# Patient Record
Sex: Female | Born: 1980 | Hispanic: No | Marital: Married | Smoking: Never smoker
Health system: Southern US, Community
[De-identification: ages and names within clinical notes are randomized; demographics above are authoritative.]

## PROBLEM LIST (undated history)

## (undated) DIAGNOSIS — E079 Disorder of thyroid, unspecified: Secondary | ICD-10-CM

---

## 2007-02-03 ENCOUNTER — Encounter: Admission: RE | Admit: 2007-02-03 | Discharge: 2007-02-03 | Payer: Self-pay | Admitting: Internal Medicine

## 2009-01-01 ENCOUNTER — Ambulatory Visit (HOSPITAL_BASED_OUTPATIENT_CLINIC_OR_DEPARTMENT_OTHER): Admission: RE | Admit: 2009-01-01 | Discharge: 2009-01-01 | Payer: Self-pay | Admitting: Family Medicine

## 2009-01-01 ENCOUNTER — Ambulatory Visit: Payer: Self-pay | Admitting: Diagnostic Radiology

## 2009-01-01 ENCOUNTER — Encounter: Payer: Self-pay | Admitting: Family Medicine

## 2009-01-21 ENCOUNTER — Encounter: Admission: RE | Admit: 2009-01-21 | Discharge: 2009-01-21 | Payer: Self-pay | Admitting: Internal Medicine

## 2009-03-03 ENCOUNTER — Ambulatory Visit (HOSPITAL_COMMUNITY): Admission: RE | Admit: 2009-03-03 | Discharge: 2009-03-03 | Payer: Self-pay | Admitting: Internal Medicine

## 2010-01-05 ENCOUNTER — Encounter: Admission: RE | Admit: 2010-01-05 | Discharge: 2010-01-05 | Payer: Self-pay | Admitting: Endocrinology

## 2010-02-18 ENCOUNTER — Emergency Department (HOSPITAL_BASED_OUTPATIENT_CLINIC_OR_DEPARTMENT_OTHER): Admission: EM | Admit: 2010-02-18 | Discharge: 2010-02-18 | Payer: Self-pay | Admitting: Emergency Medicine

## 2010-11-22 ENCOUNTER — Encounter: Payer: Self-pay | Admitting: Internal Medicine

## 2010-11-27 ENCOUNTER — Encounter
Admission: RE | Admit: 2010-11-27 | Discharge: 2010-11-27 | Payer: Self-pay | Source: Home / Self Care | Attending: Endocrinology | Admitting: Endocrinology

## 2010-12-22 IMAGING — US US SOFT TISSUE HEAD/NECK
1 series · 14 of 25 positions shown · non-contrast
Comparison: 01/21/2009, 03/03/2009

CLINICAL DATA: Thyroid goiter

THYROID ULTRASOUND
TECHNIQUE: Ultrasound examination of the thyroid gland and
adjacent soft tissues was performed.

[Series 1: us soft tissue head/neck · 0.06mm/px · 14 of 39 slices shown]
[im 1/39]
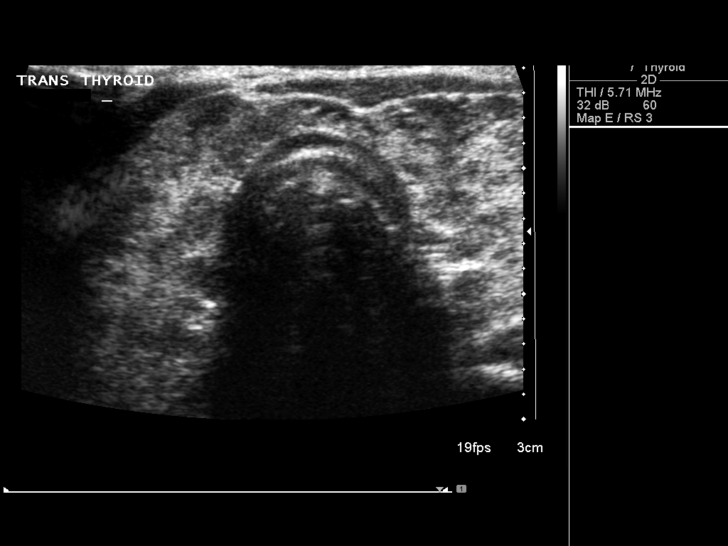
[im 4/39]
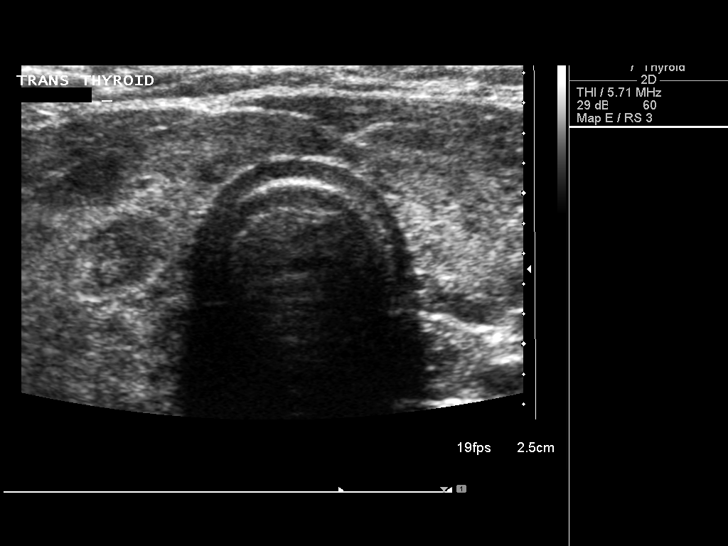
[im 7/39]
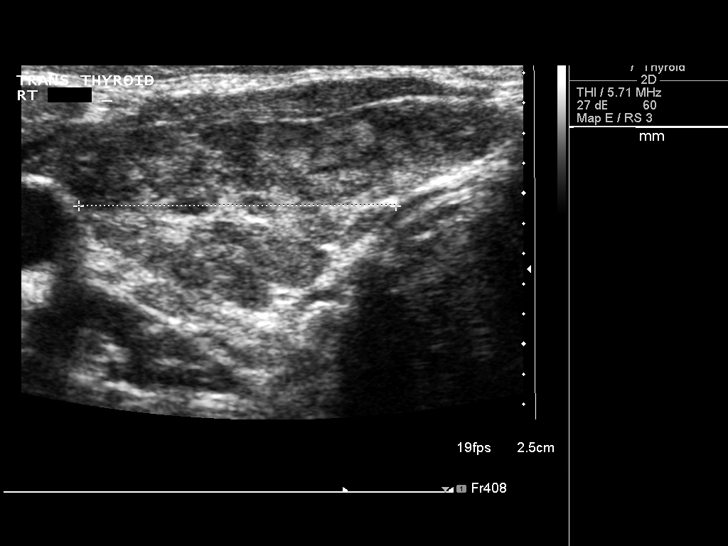
[im 10/39]
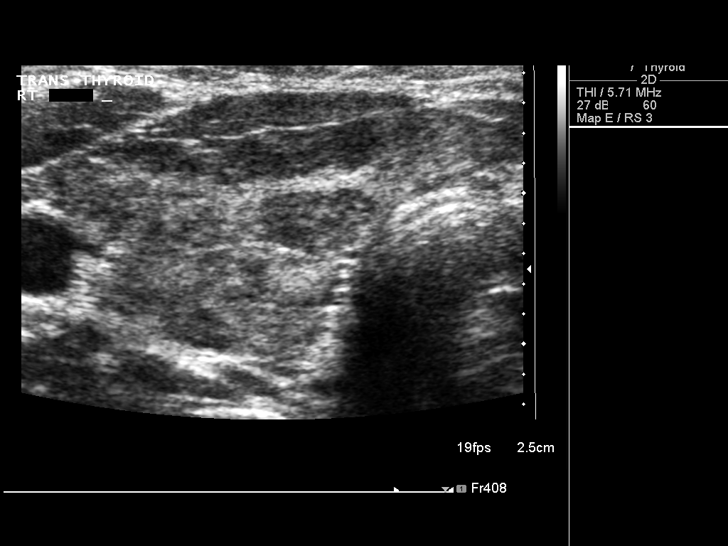
[im 13/39]
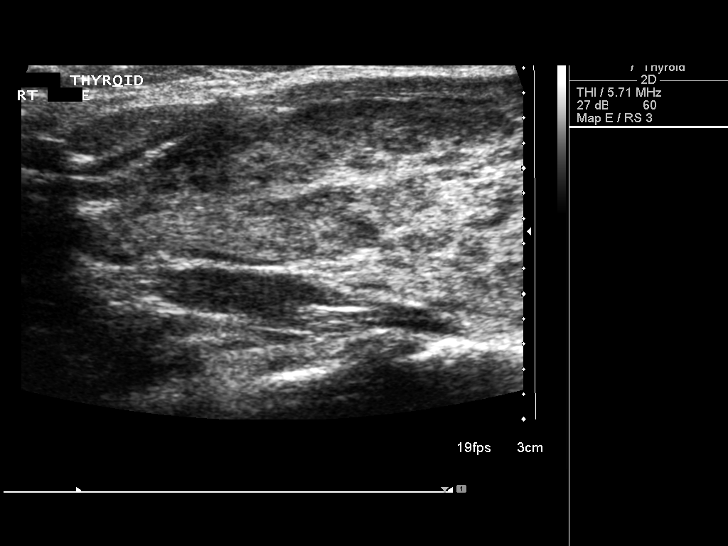
[im 15/39]
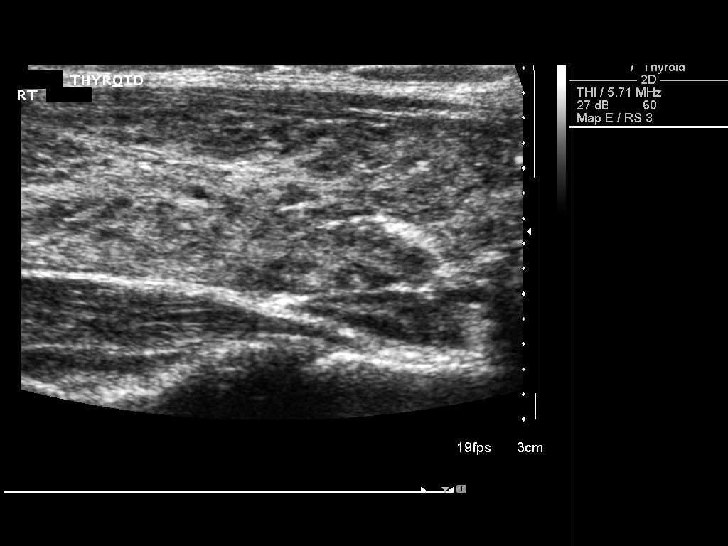
[im 18/39]
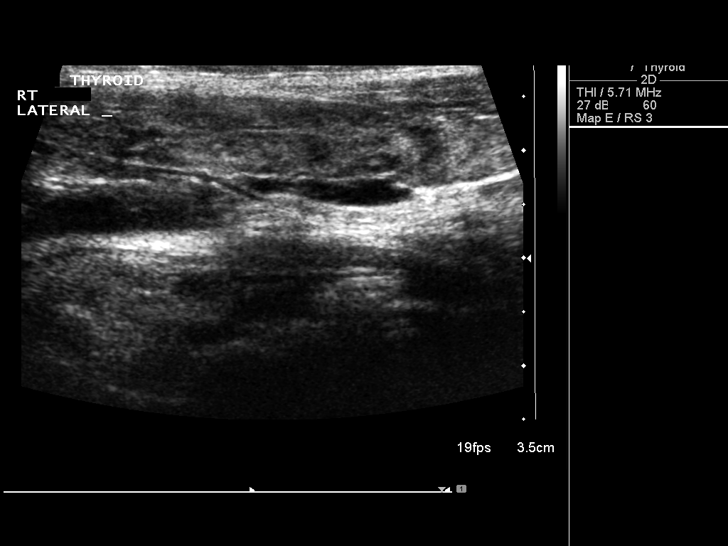
[im 21/39]
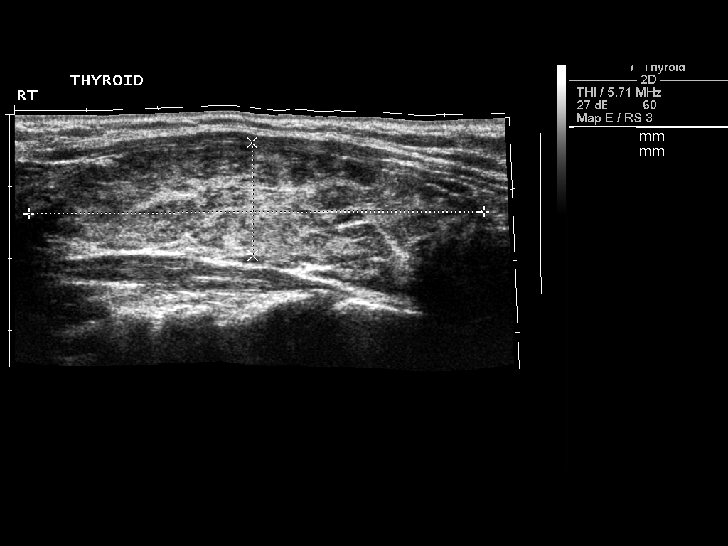
[im 24/39]
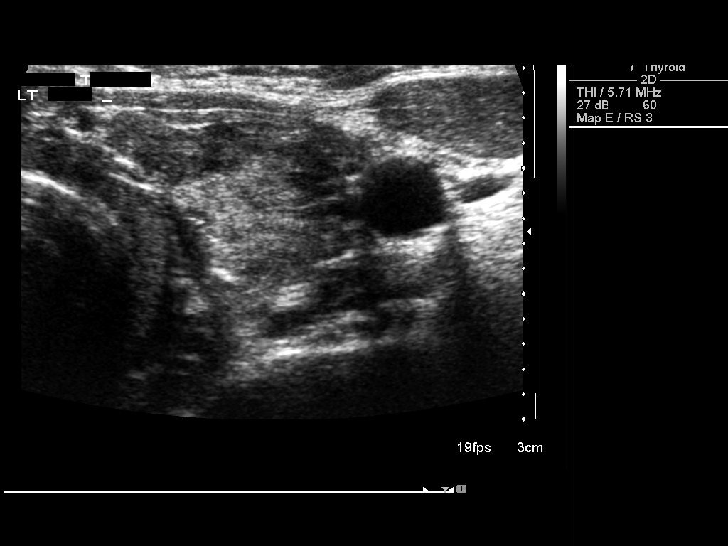
[im 26/39]
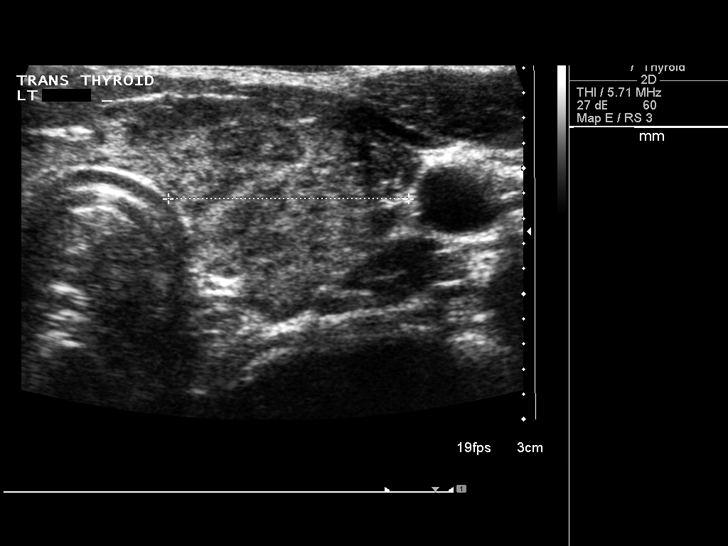
[im 29/39]
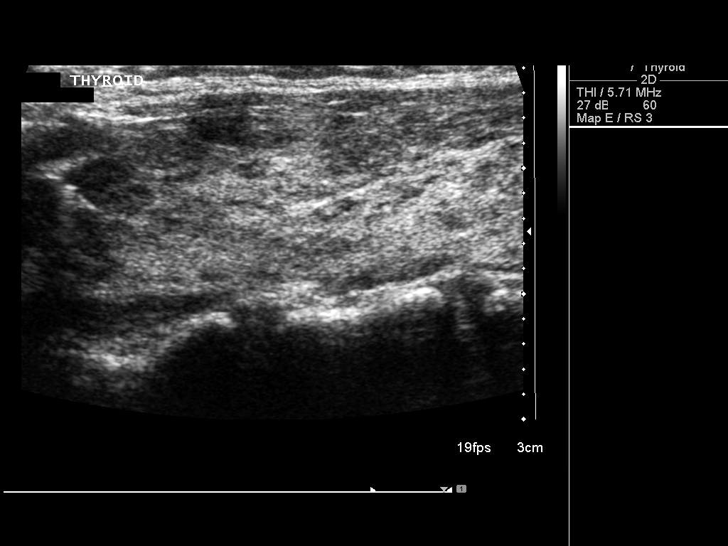
[im 32/39]
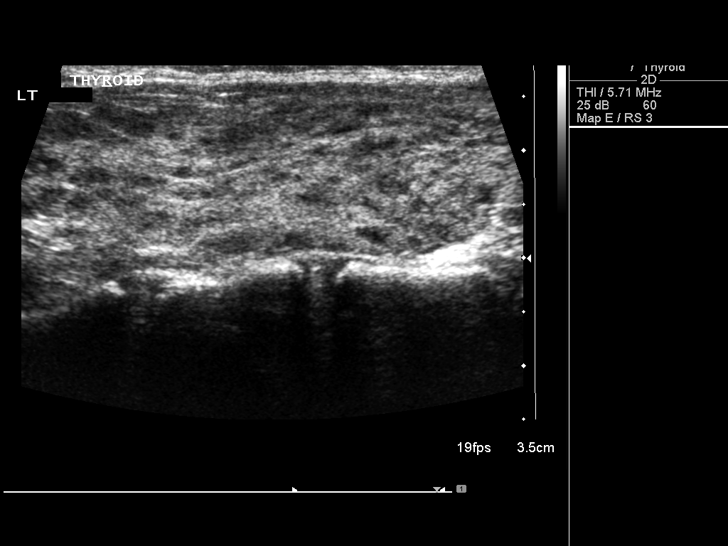
[im 35/39]
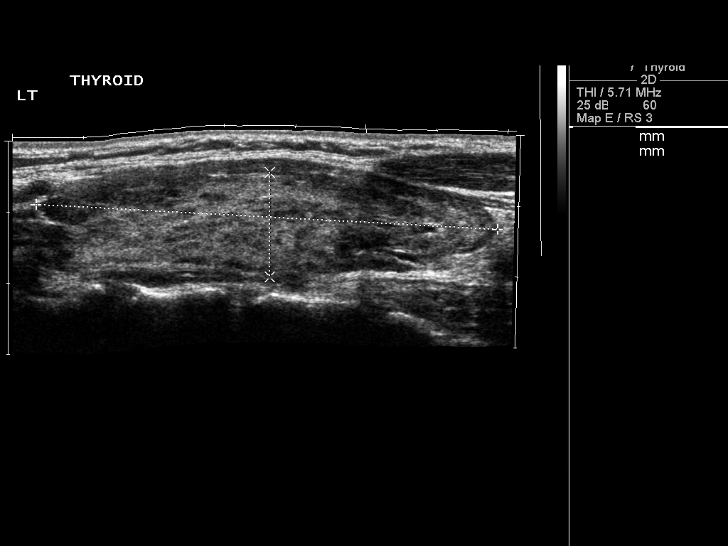
[im 39/39]
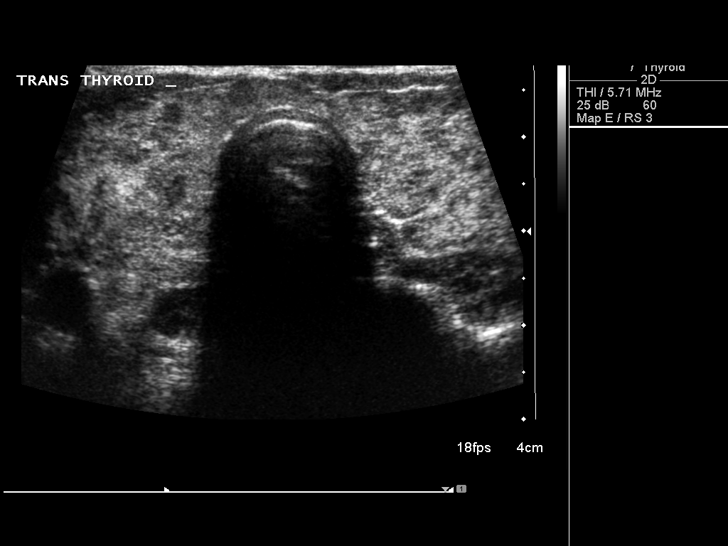

[14 of 25 positions shown; findings below may reference images not displayed]

FINDINGS: Right thyroid lobe measures 6.3 x 1.6 x 2.1 cm.  Left
thyroid lobe measures 6.5 x 1.5 x 1.9 cm.  Thyroid isthmus measures
4 mm in thickness.

Thyroid echotexture remains diffusely heterogeneous with mild
enlargement of the entire thyroid gland.  No discrete thyroid
nodule, mass or cyst.  Exam is stable.
IMPRESSION: Stable heterogeneous enlargement of the thyroid gland.

## 2011-01-19 LAB — URINALYSIS, ROUTINE W REFLEX MICROSCOPIC
Bilirubin Urine: NEGATIVE
Nitrite: NEGATIVE
Specific Gravity, Urine: 1.026 (ref 1.005–1.030)
pH: 7.5 (ref 5.0–8.0)

## 2011-01-19 LAB — URINE MICROSCOPIC-ADD ON

## 2011-11-13 IMAGING — US US SOFT TISSUE HEAD/NECK
1 series · 14 of 25 positions shown · non-contrast
Comparison: Thyroid ultrasound 01/05/2010

CLINICAL DATA: Follow-up thyroid goiter

THYROID ULTRASOUND
TECHNIQUE: Ultrasound examination of the thyroid gland and adjacent
soft tissues was performed.

[Series 1: us soft tissue head/neck · 0.08mm/px · 14 of 39 slices shown]
[im 1/39]
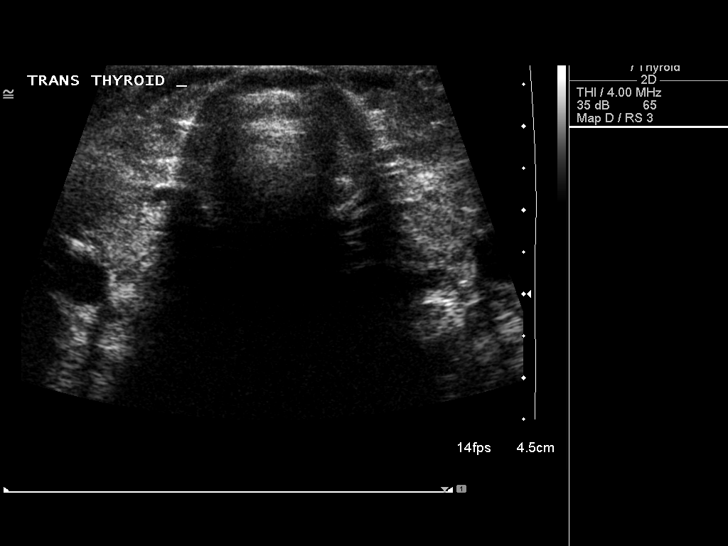
[im 4/39]
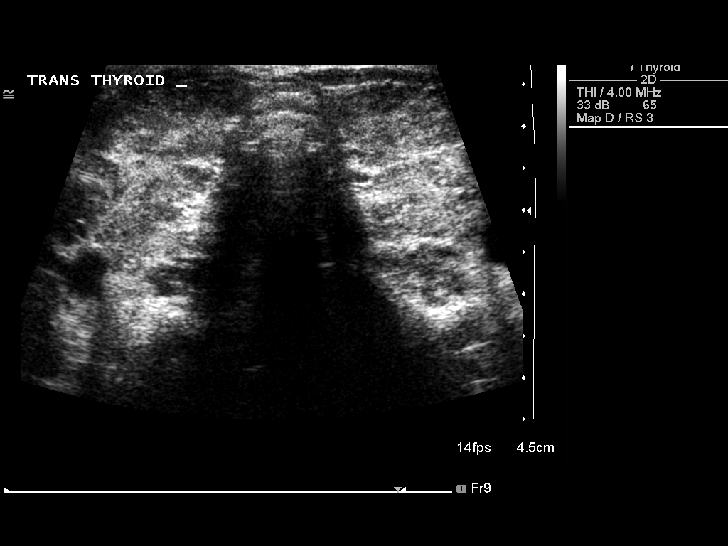
[im 7/39]
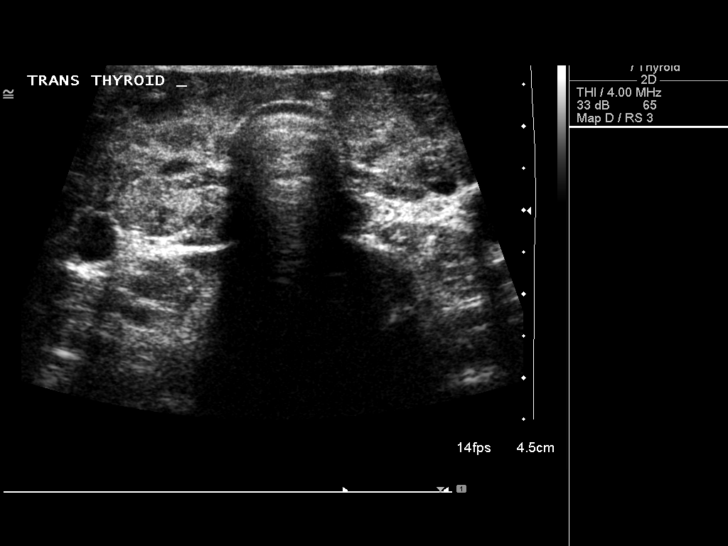
[im 10/39]
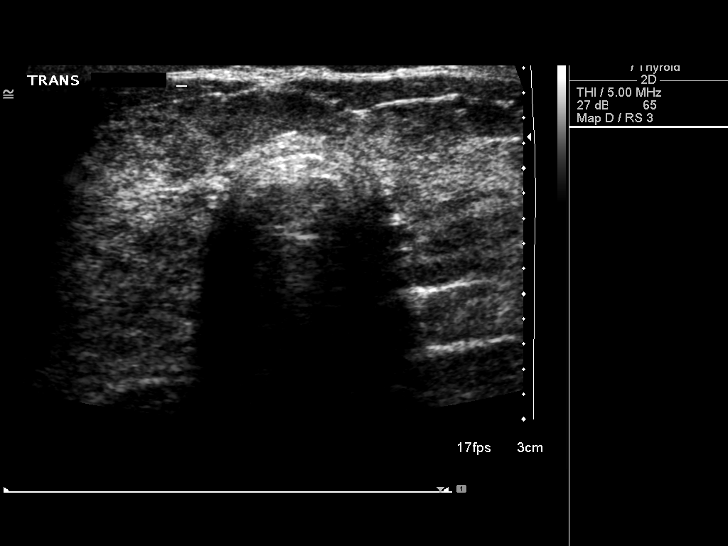
[im 13/39]
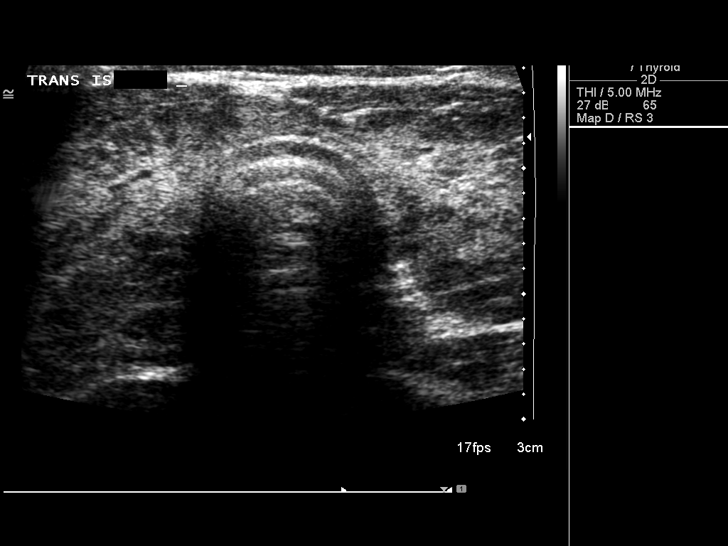
[im 15/39]
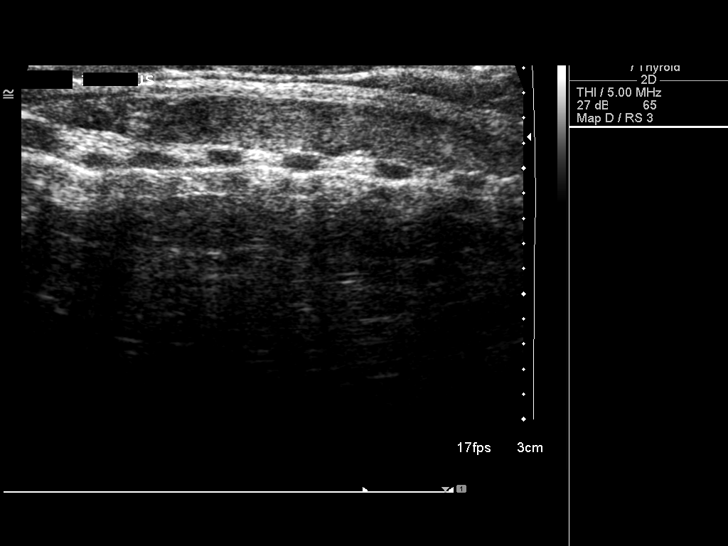
[im 18/39]
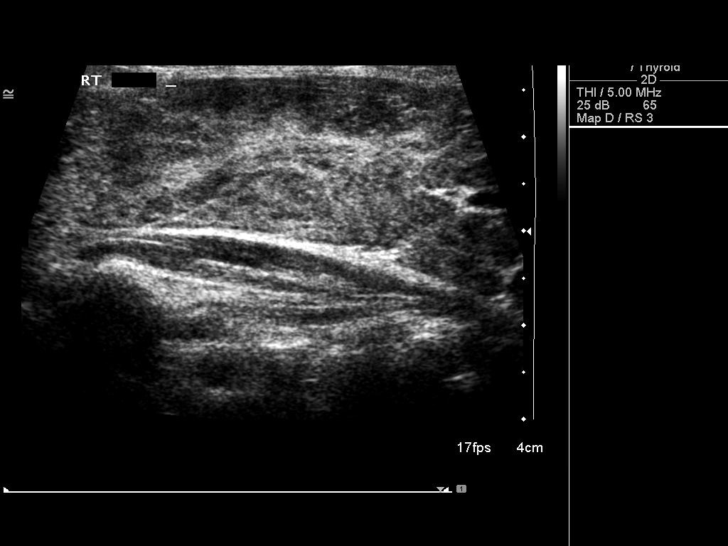
[im 21/39]
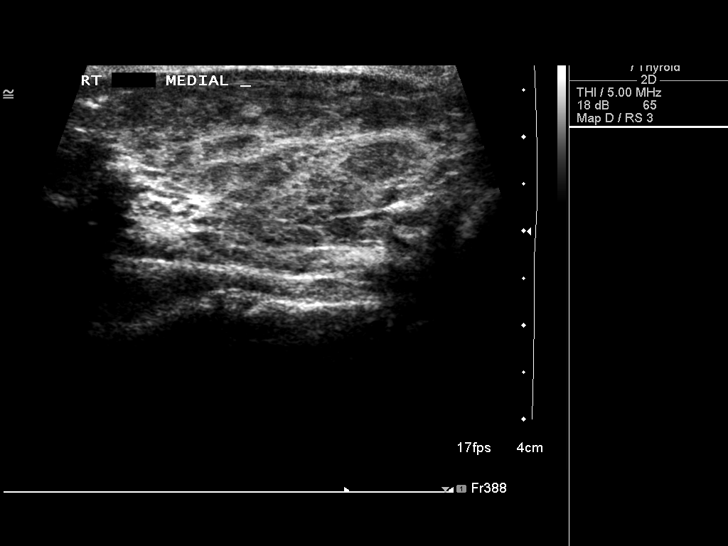
[im 24/39]
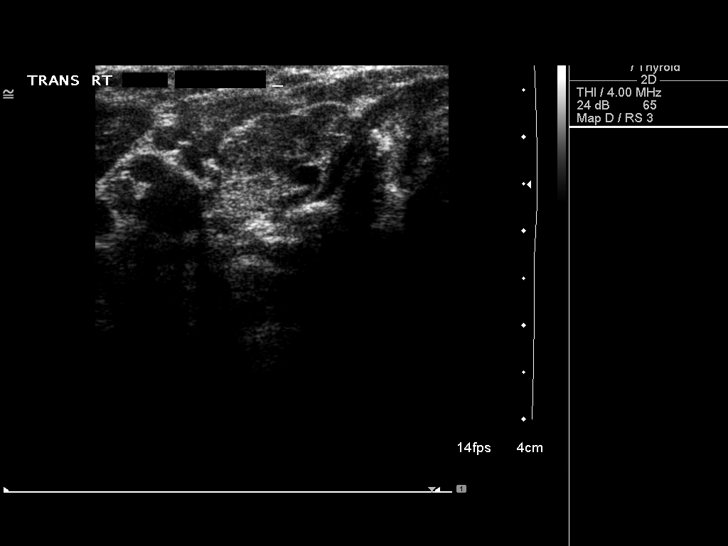
[im 26/39]
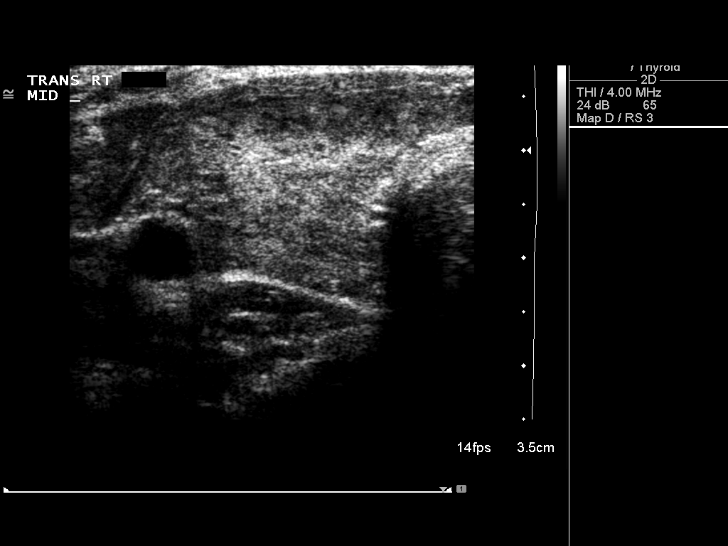
[im 29/39]
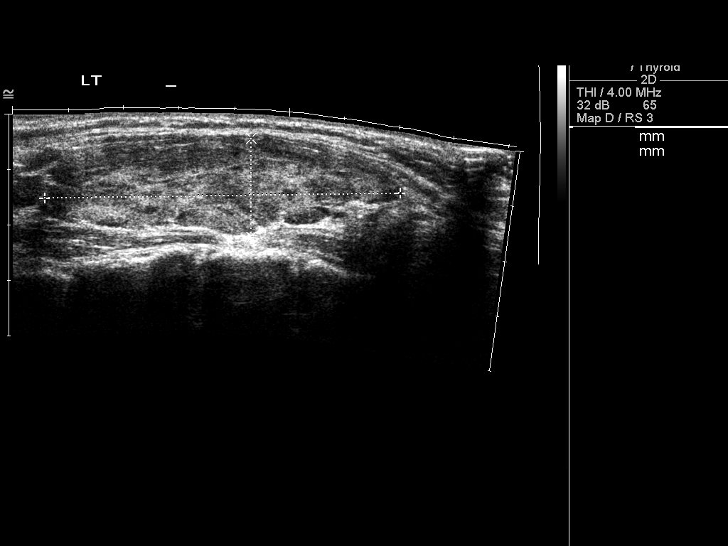
[im 32/39]
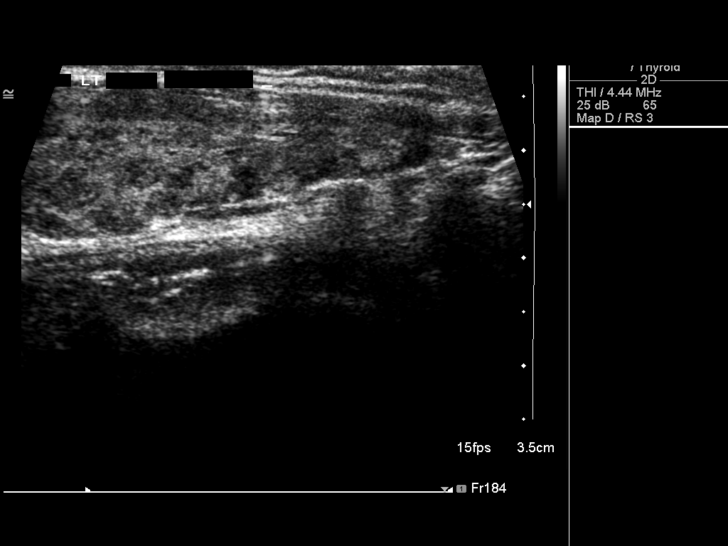
[im 35/39]
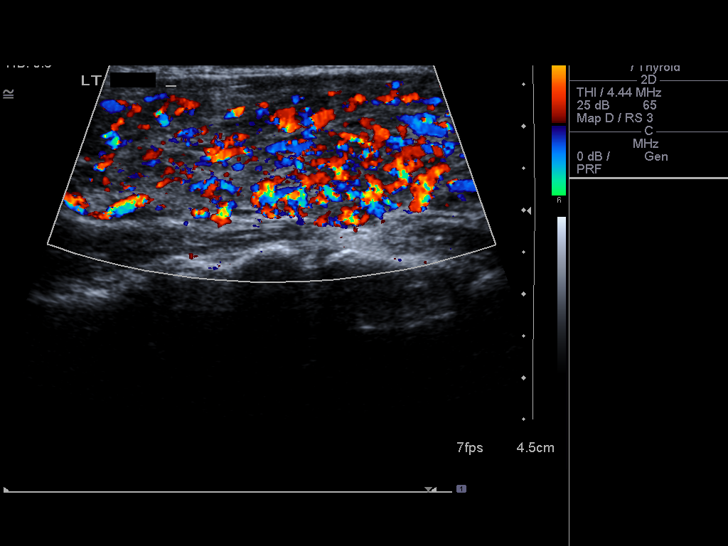
[im 39/39]
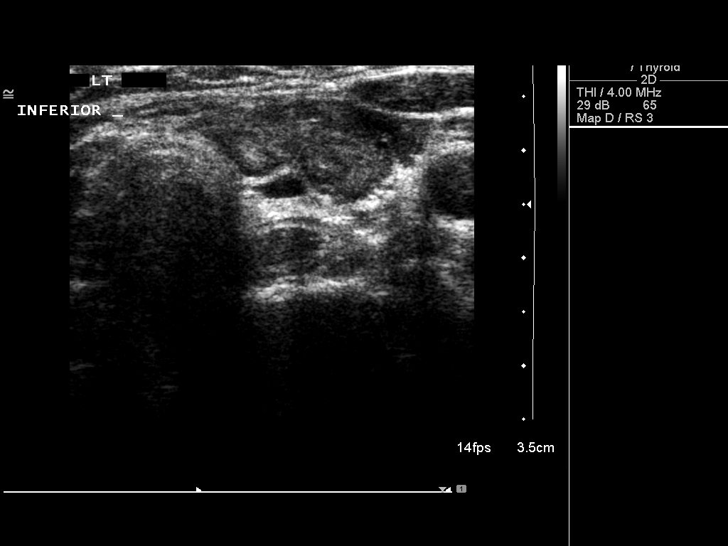

[14 of 25 positions shown; findings below may reference images not displayed]

FINDINGS: Right thyroid lobe:  6.2 x 1.8 x 2.4 cm.
Left thyroid lobe:  6.4 x 1.8 x 2.5 cm.
Isthmus:   0.4 cm.

Focal nodules:  No focal nodules.  The thyroid gland is
heterogeneous in echotexture and hypervascular.

Lymphadenopathy:  None visualized.
IMPRESSION: Heterogeneous mildly enlarged thyroid gland.  No discrete
nodularity.  No change from prior.

## 2012-01-13 ENCOUNTER — Other Ambulatory Visit (HOSPITAL_COMMUNITY)
Admission: RE | Admit: 2012-01-13 | Discharge: 2012-01-13 | Disposition: A | Discharge status: Home / Self Care | Payer: BC Managed Care – PPO | Source: Ambulatory Visit | Attending: Obstetrics and Gynecology | Admitting: Obstetrics and Gynecology

## 2012-01-13 DIAGNOSIS — Z124 Encounter for screening for malignant neoplasm of cervix: Secondary | ICD-10-CM | POA: Insufficient documentation

## 2014-11-01 HISTORY — PX: GANGLION CYST EXCISION: SHX1691

## 2018-03-12 ENCOUNTER — Other Ambulatory Visit: Payer: Self-pay

## 2018-03-12 ENCOUNTER — Encounter (HOSPITAL_BASED_OUTPATIENT_CLINIC_OR_DEPARTMENT_OTHER): Payer: Self-pay | Admitting: Emergency Medicine

## 2018-03-12 ENCOUNTER — Emergency Department (HOSPITAL_BASED_OUTPATIENT_CLINIC_OR_DEPARTMENT_OTHER)
Admission: EM | Admit: 2018-03-12 | Discharge: 2018-03-13 | Disposition: A | Discharge status: Home / Self Care | Payer: BLUE CROSS/BLUE SHIELD | Attending: Emergency Medicine | Admitting: Emergency Medicine

## 2018-03-12 DIAGNOSIS — B0231 Zoster conjunctivitis: Secondary | ICD-10-CM | POA: Diagnosis not present

## 2018-03-12 DIAGNOSIS — R51 Headache: Secondary | ICD-10-CM | POA: Diagnosis present

## 2018-03-12 DIAGNOSIS — Z79899 Other long term (current) drug therapy: Secondary | ICD-10-CM | POA: Insufficient documentation

## 2018-03-12 HISTORY — DX: Disorder of thyroid, unspecified: E07.9

## 2018-03-12 LAB — LIPASE, BLOOD: Lipase: 23 U/L (ref 11–51)

## 2018-03-12 LAB — BASIC METABOLIC PANEL
ANION GAP: 9 (ref 5–15)
BUN: 13 mg/dL (ref 6–20)
CHLORIDE: 106 mmol/L (ref 101–111)
CO2: 23 mmol/L (ref 22–32)
CREATININE: 0.6 mg/dL (ref 0.44–1.00)
Calcium: 8.8 mg/dL — ABNORMAL LOW (ref 8.9–10.3)
GFR calc non Af Amer: >60 mL/min (ref >60)
Glucose, Bld: 138 mg/dL — ABNORMAL HIGH (ref 65–99)
Potassium: 4.1 mmol/L (ref 3.5–5.1)
SODIUM: 138 mmol/L (ref 135–145)

## 2018-03-12 LAB — CBC WITH DIFFERENTIAL/PLATELET
Basophils Absolute: 0 10*3/uL (ref 0.0–0.1)
Basophils Relative: 0 %
EOS ABS: 0.1 10*3/uL (ref 0.0–0.7)
Eosinophils Relative: 1 %
HEMATOCRIT: 38.6 % (ref 36.0–46.0)
HEMOGLOBIN: 13 g/dL (ref 12.0–15.0)
LYMPHS ABS: 1.8 10*3/uL (ref 0.7–4.0)
Lymphocytes Relative: 16 %
MCH: 28.5 pg (ref 26.0–34.0)
MCHC: 33.7 g/dL (ref 30.0–36.0)
MCV: 84.6 fL (ref 78.0–100.0)
MONOS PCT: 7 %
Monocytes Absolute: 0.8 10*3/uL (ref 0.1–1.0)
Neutro Abs: 8.9 10*3/uL — ABNORMAL HIGH (ref 1.7–7.7)
Neutrophils Relative %: 76 %
Platelets: 273 10*3/uL (ref 150–400)
RBC: 4.56 MIL/uL (ref 3.87–5.11)
RDW: 13.1 % (ref 11.5–15.5)
WBC: 11.7 10*3/uL — AB (ref 4.0–10.5)

## 2018-03-12 LAB — PREGNANCY, URINE: Preg Test, Ur: NEGATIVE

## 2018-03-12 LAB — HEPATIC FUNCTION PANEL
ALBUMIN: 3.9 g/dL (ref 3.5–5.0)
ALK PHOS: 50 U/L (ref 38–126)
ALT: 12 U/L — AB (ref 14–54)
AST: 16 U/L (ref 15–41)
BILIRUBIN TOTAL: 0.4 mg/dL (ref 0.3–1.2)
Bilirubin, Direct: <0.1 mg/dL — ABNORMAL LOW (ref 0.1–0.5)
TOTAL PROTEIN: 7.6 g/dL (ref 6.5–8.1)

## 2018-03-12 MED ORDER — PROMETHAZINE HCL 25 MG/ML IJ SOLN
25.0000 mg | Freq: Once | INTRAMUSCULAR | Status: AC
  Administered 2018-03-12: 25 mg via INTRAVENOUS
  Filled 2018-03-12: qty 1

## 2018-03-12 MED ORDER — TETRACAINE HCL 0.5 % OP SOLN
1.0000 [drp] | Freq: Once | OPHTHALMIC | Status: AC
  Administered 2018-03-12: 1 [drp] via OPHTHALMIC

## 2018-03-12 MED ORDER — VALACYCLOVIR HCL 1 G PO TABS: 1000.0000 mg | ORAL_TABLET | Freq: Three times a day (TID) | ORAL | 0 refills | Status: AC

## 2018-03-12 MED ORDER — ONDANSETRON HCL 4 MG PO TABS: 4.0000 mg | ORAL_TABLET | Freq: Three times a day (TID) | ORAL | 0 refills | Status: AC | PRN

## 2018-03-12 MED ORDER — ARTIFICIAL TEARS OPHTHALMIC OINT: TOPICAL_OINTMENT | OPHTHALMIC | 0 refills | Status: AC | PRN

## 2018-03-12 MED ORDER — HYDROMORPHONE HCL 1 MG/ML IJ SOLN
0.5000 mg | Freq: Once | INTRAMUSCULAR | Status: AC
  Administered 2018-03-12: 0.5 mg via INTRAVENOUS
  Filled 2018-03-12: qty 1

## 2018-03-12 MED ORDER — TETRACAINE HCL 0.5 % OP SOLN
2.0000 [drp] | Freq: Once | OPHTHALMIC | Status: AC
  Administered 2018-03-12: 2 [drp] via OPHTHALMIC
  Filled 2018-03-12: qty 4

## 2018-03-12 MED ORDER — OXYCODONE-ACETAMINOPHEN 5-325 MG PO TABS: 1.0000 | ORAL_TABLET | ORAL | 0 refills | Status: AC | PRN

## 2018-03-12 MED ORDER — HALOPERIDOL LACTATE 5 MG/ML IJ SOLN
2.0000 mg | Freq: Once | INTRAMUSCULAR | Status: AC
  Administered 2018-03-12: 2 mg via INTRAVENOUS
  Filled 2018-03-12: qty 1

## 2018-03-12 MED ORDER — VALACYCLOVIR HCL 500 MG PO TABS
1000.0000 mg | ORAL_TABLET | Freq: Once | ORAL | Status: AC
  Administered 2018-03-12: 1000 mg via ORAL
  Filled 2018-03-12: qty 2

## 2018-03-12 MED ORDER — FLUORESCEIN SODIUM 1 MG OP STRP
1.0000 | ORAL_STRIP | Freq: Once | OPHTHALMIC | Status: AC
  Administered 2018-03-12: 1 via OPHTHALMIC
  Filled 2018-03-12: qty 1

## 2018-03-12 MED ORDER — SODIUM CHLORIDE 0.9 % IV BOLUS
1000.0000 mL | Freq: Once | INTRAVENOUS | Status: AC
  Administered 2018-03-12: 1000 mL via INTRAVENOUS

## 2018-03-12 MED ORDER — METHYLPREDNISOLONE 4 MG PO TBPK: ORAL_TABLET | ORAL | 0 refills | Status: AC

## 2018-03-12 MED ORDER — ONDANSETRON HCL 4 MG/2ML IJ SOLN: 4.0000 mg | INTRAMUSCULAR | Status: DC

## 2018-03-12 NOTE — ED Notes (Signed)
EDP & EDPA at [facility] 

## 2018-03-12 NOTE — Discharge Instructions (Addendum)
Contact a health care provider if: °Your pain is not relieved with prescribed medicines. °Your pain does not get better after the rash heals. °Your rash looks infected. Signs of infection include redness, swelling, and pain that lasts or increases. °Get help right away if: °The rash is on your face or nose. °You have facial pain, pain around your eye area, or loss of feeling on one side of your face. °You have ear pain or you have ringing in your ear. °You have loss of taste. °Your condition gets worse. °

## 2018-03-12 NOTE — ED Provider Notes (Signed)
[facility] EMERGENCY DEPARTMENT Provider Note   CSN: [id number] Arrival date & time: 03/12/18  1823     History   Chief Complaint Chief Complaint  Patient presents with  . Headache    HPI Amanda Donaldson is a 37 y.o. female.  Who presents the emergency department with headache and eye pain. The patient has had 3 days of Headache, nausea and vomiting. This morning the patient developed itching and watering in her Left eye followed by severe pain, and then pain in the left scalp which she describes as burning and electrical. The pain is severe. She denies fevers or chills.  He is never had anything like this before  HPI  Past Medical History:  Diagnosis Date  . Thyroid disease     There are no active problems to display for this patient.   History reviewed. No pertinent surgical history.   OB History   None      Home Medications    Prior to Admission medications   Medication Sig Start Date End Date Taking? Authorizing Provider  levothyroxine (SYNTHROID, LEVOTHROID) 100 MCG tablet Take 100 mcg by mouth daily before breakfast.   Yes [provider]  artificial tears (LACRILUBE) OINT ophthalmic ointment Place into both eyes every 4 (four) hours as needed for dry eyes. 03/12/18   Arthor Captain, PA-C  methylPREDNISolone (MEDROL DOSEPAK) 4 MG TBPK tablet Use as directed 03/12/18   Arthor Captain, PA-C  ondansetron (ZOFRAN) 4 MG tablet Take 1 tablet (4 mg total) by mouth every 8 (eight) hours as needed for nausea or vomiting. 03/12/18   Arthor Captain, PA-C  oxyCODONE-acetaminophen (PERCOCET) 5-325 MG tablet Take 1-2 tablets by mouth every 4 (four) hours as needed. 03/12/18   Octavie Westerhold, Cammy Copa, PA-C  valACYclovir (VALTREX) 1000 MG tablet Take 1 tablet (1,000 mg total) by mouth 3 (three) times daily. 03/12/18   Arthor Captain, PA-C    Family History History reviewed. No pertinent family history.  Social History Social History   Tobacco Use  .  Smoking status: Never Smoker  . Smokeless tobacco: Never Used  Substance Use Topics  . Alcohol use: Never    Frequency: Never  . Drug use: Never     Allergies   Ultram [tramadol]   Review of Systems Review of Systems Ten systems reviewed and are negative for acute change, except as noted in the HPI.    Physical Exam Updated Vital Signs BP 97/64 (BP Location: Left Arm)   Pulse (!) 55   Temp 98 F (36.7 C) (Oral)   Resp 20   Ht  (1.651 m)   Wt 68.9 kg (152 lb)   LMP 03/12/2018   SpO2 99%   BMI 25.29 kg/m   Physical Exam  Constitutional: She is oriented to person, place, and time. She appears well-developed and well-nourished. No distress.  HENT:  Head: Normocephalic and atraumatic.  Mouth/Throat: Oropharynx is clear and moist.  Eyes: Pupils are equal, round, and reactive to light. EOM are normal. Right eye exhibits no chemosis. Left eye exhibits no chemosis. Right conjunctiva is injected. Left conjunctiva is injected. No scleral icterus.  Slit lamp exam:      The right eye shows no corneal flare and no fluorescein uptake.       The left eye shows fluorescein uptake. The left eye shows no corneal flare.    No horizontal, vertical or rotational nystagmus  Neck: Normal range of motion. Neck supple.  Full active and passive ROM  without pain No midline or paraspinal tenderness No nuchal rigidity or meningeal signs  Cardiovascular: Normal rate, regular rhythm, normal heart sounds and intact distal pulses. Exam reveals no gallop and no friction rub.  No murmur heard. Pulmonary/Chest: Effort normal and breath sounds normal. No respiratory distress. She has no wheezes. She has no rales.  Abdominal: Soft. Bowel sounds are normal. She exhibits no distension and no mass. There is no tenderness. There is no rebound and no guarding.  Musculoskeletal: Normal range of motion.  Lymphadenopathy:    She has no cervical adenopathy.  Neurological: She is alert and oriented to  person, place, and time. No cranial nerve deficit. She exhibits normal muscle tone. Coordination normal.  Mental Status:  Alert, oriented, thought content appropriate. Speech fluent without evidence of aphasia. Able to follow 2 step commands without difficulty.  Cranial Nerves:  II:  Peripheral visual fields grossly normal, pupils equal, round, reactive to light III,IV, VI: ptosis not present, extra-ocular motions intact bilaterally  V,VII: smile symmetric, facial light touch sensation equal VIII: hearing grossly normal bilaterally  IX,X: midline uvula rise  XI: bilateral shoulder shrug equal and strong XII: midline tongue extension  Motor:  5/5 in upper and lower extremities bilaterally including strong and equal grip strength and dorsiflexion/plantar flexion Sensory: Pinprick and light touch normal in all extremities.  Cerebellar: normal finger-to-nose with bilateral upper extremities Gait: normal gait and balance CV: distal pulses palpable throughout   Skin: Skin is warm and dry. Rash noted. She is not diaphoretic.  Vesicular rash on the left upper forehead and left side of the scalp consistent with a shingles  Psychiatric: She has a normal mood and affect. Her behavior is normal. Judgment and thought content normal.  Nursing note and vitals reviewed.    ED Treatments / Results  Labs (all labs ordered are listed, but only abnormal results are displayed) Labs Reviewed  BASIC METABOLIC PANEL - Abnormal; Notable for the following components:      Result Value   Glucose, Bld 138 (*)    Calcium 8.8 (*)    All other components within normal limits  CBC WITH DIFFERENTIAL/PLATELET - Abnormal; Notable for the following components:   WBC 11.7 (*)    Neutro Abs 8.9 (*)    All other components within normal limits  HEPATIC FUNCTION PANEL - Abnormal; Notable for the following components:   ALT 12 (*)    Bilirubin, Direct <0.1 (*)    All other components within normal limits  PREGNANCY,  URINE  LIPASE, BLOOD    EKG None  Radiology No results found.  Procedures Procedures (including critical care time)  Medications Ordered in ED Medications  sodium chloride 0.9 % bolus 1,000 mL (0 mLs Intravenous Stopped 03/12/18 2241)  fluorescein ophthalmic strip 1 strip (1 strip Left Eye Given 03/12/18 2011)  tetracaine (PONTOCAINE) 0.5 % ophthalmic solution 2 drop (2 drops Both Eyes Given 03/12/18 2011)  HYDROmorphone (DILAUDID) injection 0.5 mg (0.5 mg Intravenous Given 03/12/18 2010)  promethazine (PHENERGAN) injection 25 mg (25 mg Intravenous Given 03/12/18 2010)  valACYclovir (VALTREX) tablet 1,000 mg (1,000 mg Oral Given 03/12/18 2315)  haloperidol lactate (HALDOL) injection 2 mg (2 mg Intravenous Given 03/12/18 2314)  tetracaine (PONTOCAINE) 0.5 % ophthalmic solution 1 drop (1 drop Left Eye Given 03/12/18 2318)  HYDROmorphone (DILAUDID) injection 0.5 mg (0.5 mg Intravenous Given 03/12/18 2330)     Initial Impression / Assessment and Plan / ED Course  I have reviewed the triage vital signs and  the nursing notes.  Pertinent labs & imaging results that were available during my care of the patient were reviewed by me and considered in my medical decision making (see chart for details).    Patient with herpes zoster keratitis.  Pain and nausea have improved.  She has been able to tolerate p.o. fluids.  I discussed the case with Dr. Genia Del who is on-call for ophthalmology who asked that the patient come see him in the morning for reevaluation.  He recommends Valtrex, prednisone, lubricating eyedrops.  Patient will also get pain and antinausea medications.  I have answered all questions the patient and her husband to the best my ability.  Labs reviewed without significant abnormality.  Patient appears appropriate for discharge at this time with close ophthalmologic follow-up.  I discussed return precautions with the patient and her husband.  Final Clinical Impressions(s) / ED Diagnoses    Final diagnoses:  Herpes zoster conjunctivitis    ED Discharge Orders        Ordered    valACYclovir (VALTREX) 1000 MG tablet  3 times daily     03/12/18 2343    methylPREDNISolone (MEDROL DOSEPAK) 4 MG TBPK tablet     03/12/18 2343    ondansetron (ZOFRAN) 4 MG tablet  Every 8 hours PRN     03/12/18 2343    oxyCODONE-acetaminophen (PERCOCET) 5-325 MG tablet  Every 4 hours PRN     03/12/18 2343    artificial tears (LACRILUBE) OINT ophthalmic ointment  Every 4 hours PRN     03/12/18 2343       Arthor Captain, PA-C 03/12/18 2347    Benjiman Core, MD 03/12/18 2359

## 2018-03-12 NOTE — ED Triage Notes (Signed)
Patients family member states that the patient has N/V and headache for the last few days, she went to urgent care earlier this week. THis am she woke up with headache and eye pain she went to urgent care again - was given Hydrocodone and eye drops. Was fine until this afternoon when she developed left eye pain and headache again with Nause

## 2018-03-12 NOTE — ED Notes (Signed)
Resting, supine, HOB 30 degrees, NAD, calm, VSS, husband at [facility], reports pain and nausea lessened, but remains.

## 2018-03-12 NOTE — ED Notes (Signed)
Alert, NAD, calm, interactive, resps e/u, speaking in clear complete sentences, no dyspnea noted, skin W&D, c/o L eye pain, L HA, nausea, actively vomiting, and rash noted to L forehead into hairline. EDPA into room. Family at [facility].

## 2018-03-12 NOTE — ED Notes (Signed)
Sedated sleeping, NAD, calm, VSS, husband at [facility].

## 2020-09-01 ENCOUNTER — Other Ambulatory Visit: Payer: Self-pay | Admitting: Endocrinology

## 2020-09-01 DIAGNOSIS — E049 Nontoxic goiter, unspecified: Secondary | ICD-10-CM

## 2022-07-10 ENCOUNTER — Other Ambulatory Visit: Payer: Self-pay

## 2022-07-10 ENCOUNTER — Encounter (HOSPITAL_BASED_OUTPATIENT_CLINIC_OR_DEPARTMENT_OTHER): Payer: Self-pay

## 2022-07-10 DIAGNOSIS — R42 Dizziness and giddiness: Secondary | ICD-10-CM | POA: Diagnosis present

## 2022-07-10 DIAGNOSIS — E039 Hypothyroidism, unspecified: Secondary | ICD-10-CM | POA: Diagnosis not present

## 2022-07-10 DIAGNOSIS — Z79899 Other long term (current) drug therapy: Secondary | ICD-10-CM | POA: Diagnosis not present

## 2022-07-10 LAB — CBC
HCT: 37.4 % (ref 36.0–46.0)
Hemoglobin: 12.3 g/dL (ref 12.0–15.0)
MCH: 27.6 pg (ref 26.0–34.0)
MCHC: 32.9 g/dL (ref 30.0–36.0)
MCV: 84 fL (ref 80.0–100.0)
Platelets: 326 10*3/uL (ref 150–400)
RBC: 4.45 MIL/uL (ref 3.87–5.11)
RDW: 13.7 % (ref 11.5–15.5)
WBC: 9.5 10*3/uL (ref 4.0–10.5)
nRBC: 0 % (ref 0.0–0.2)

## 2022-07-10 LAB — CBG MONITORING, ED: Glucose-Capillary: 130 mg/dL — ABNORMAL HIGH (ref 70–99)

## 2022-07-10 NOTE — ED Triage Notes (Signed)
Pt reports dizziness that began yesterday. Pt reports that she feels like she is "swirling" and it only happens at night when she goes to bed. Reports a HA as well x2 week, recently dx with COVID. Denies other sx.

## 2022-07-11 ENCOUNTER — Emergency Department (HOSPITAL_BASED_OUTPATIENT_CLINIC_OR_DEPARTMENT_OTHER)
Admission: EM | Admit: 2022-07-11 | Discharge: 2022-07-11 | Disposition: A | Discharge status: Home / Self Care | Payer: No Typology Code available for payment source | Attending: Emergency Medicine | Admitting: Emergency Medicine

## 2022-07-11 ENCOUNTER — Emergency Department (HOSPITAL_BASED_OUTPATIENT_CLINIC_OR_DEPARTMENT_OTHER): Payer: No Typology Code available for payment source

## 2022-07-11 DIAGNOSIS — R42 Dizziness and giddiness: Secondary | ICD-10-CM

## 2022-07-11 LAB — BASIC METABOLIC PANEL
Anion gap: 5 (ref 5–15)
BUN: 8 mg/dL (ref 6–20)
CO2: 25 mmol/L (ref 22–32)
Calcium: 8.9 mg/dL (ref 8.9–10.3)
Chloride: 109 mmol/L (ref 98–111)
Creatinine, Ser: 0.64 mg/dL (ref 0.44–1.00)
GFR, Estimated: >60 mL/min (ref >60)
Glucose, Bld: 149 mg/dL — ABNORMAL HIGH (ref 70–99)
Potassium: 3.9 mmol/L (ref 3.5–5.1)
Sodium: 139 mmol/L (ref 135–145)

## 2022-07-11 LAB — URINALYSIS, MICROSCOPIC (REFLEX)

## 2022-07-11 LAB — URINALYSIS, ROUTINE W REFLEX MICROSCOPIC
Bilirubin Urine: NEGATIVE
Glucose, UA: NEGATIVE mg/dL
Ketones, ur: NEGATIVE mg/dL
Nitrite: NEGATIVE
Protein, ur: NEGATIVE mg/dL
Specific Gravity, Urine: <=1.005 (ref 1.005–1.030)
pH: 6 (ref 5.0–8.0)

## 2022-07-11 LAB — PREGNANCY, URINE: Preg Test, Ur: NEGATIVE

## 2022-07-11 MED ORDER — MECLIZINE HCL 25 MG PO TABS: 25.0000 mg | ORAL_TABLET | Freq: Three times a day (TID) | ORAL | 0 refills | Status: AC | PRN

## 2022-07-11 MED ORDER — MECLIZINE HCL 25 MG PO TABS
25.0000 mg | ORAL_TABLET | Freq: Once | ORAL | Status: AC
  Administered 2022-07-11: 25 mg via ORAL
  Filled 2022-07-11: qty 1

## 2022-07-11 NOTE — Discharge Instructions (Signed)
Begin taking meclizine as prescribed as needed for dizziness.  Follow-up with your primary doctor if symptoms are not improving in the next week, and return to the ER if you develop severe headache, weakness/numbness, visual disturbances, or for other new and concerning symptoms.

## 2022-07-11 NOTE — ED Provider Notes (Signed)
[facility] EMERGENCY DEPARTMENT Provider Note   CSN: [id number] Arrival date & time: 07/10/22  2327     History  Chief Complaint  Patient presents with   Dizziness    Mahaley Rajee Neyla Gauntt is a 41 y.o. female.  Patient is a 41 year old female with past medical history of hypothyroidism.  Patient presenting today for evaluation of dizziness.  She describes a spinning sensation that is worse when she lies flat and tries to sleep at night.  This has also occurred over the past 2 days when she bends over to pick up objects.  She denies any weakness or numbness.  Symptoms are improved somewhat with remaining still.  The history is provided by the patient.       Home Medications Prior to Admission medications   Medication Sig Start Date End Date Taking? Authorizing Provider  artificial tears (LACRILUBE) OINT ophthalmic ointment Place into both eyes every 4 (four) hours as needed for dry eyes. 03/12/18   Arthor Captain, PA-C  levothyroxine (SYNTHROID, LEVOTHROID) 100 MCG tablet Take 100 mcg by mouth daily before breakfast.    [provider]  methylPREDNISolone (MEDROL DOSEPAK) 4 MG TBPK tablet Use as directed 03/12/18   Arthor Captain, PA-C  ondansetron (ZOFRAN) 4 MG tablet Take 1 tablet (4 mg total) by mouth every 8 (eight) hours as needed for nausea or vomiting. 03/12/18   Arthor Captain, PA-C  oxyCODONE-acetaminophen (PERCOCET) 5-325 MG tablet Take 1-2 tablets by mouth every 4 (four) hours as needed. 03/12/18   Harris, Cammy Copa, PA-C  valACYclovir (VALTREX) 1000 MG tablet Take 1 tablet (1,000 mg total) by mouth 3 (three) times daily. 03/12/18   Arthor Captain, PA-C      Allergies    Ultram [tramadol]    Review of Systems   Review of Systems  All other systems reviewed and are negative.   Physical Exam Updated Vital Signs BP 127/87   Pulse 74   Temp 98.2 F (36.8 C) (Oral)   Resp 18   Ht 5\' 6"  (1.676 m)   Wt 72.6 kg   LMP 07/10/2022   SpO2 97%    BMI 25.82 kg/m  Physical Exam Vitals and nursing note reviewed.  Constitutional:      General: She is not in acute distress.    Appearance: She is well-developed. She is not diaphoretic.  HENT:     Head: Normocephalic and atraumatic.     Right Ear: Tympanic membrane normal.     Left Ear: Tympanic membrane normal.  Eyes:     Extraocular Movements: Extraocular movements intact.     Pupils: Pupils are equal, round, and reactive to light.  Cardiovascular:     Rate and Rhythm: Normal rate and regular rhythm.     Heart sounds: No murmur heard.    No friction rub. No gallop.  Pulmonary:     Effort: Pulmonary effort is normal. No respiratory distress.     Breath sounds: Normal breath sounds. No wheezing.  Abdominal:     General: Bowel sounds are normal. There is no distension.     Palpations: Abdomen is soft.     Tenderness: There is no abdominal tenderness.  Musculoskeletal:        General: Normal range of motion.     Cervical back: Normal range of motion and neck supple.  Skin:    General: Skin is warm and dry.  Neurological:     General: No focal deficit present.     Mental Status: She  is alert and oriented to person, place, and time.     Cranial Nerves: No cranial nerve deficit.     Sensory: No sensory deficit.     Motor: No weakness.     Coordination: Coordination normal.     ED Results / Procedures / Treatments   Labs (all labs ordered are listed, but only abnormal results are displayed) Labs Reviewed  BASIC METABOLIC PANEL - Abnormal; Notable for the following components:      Result Value   Glucose, Bld 149 (*)    All other components within normal limits  URINALYSIS, ROUTINE W REFLEX MICROSCOPIC - Abnormal; Notable for the following components:   APPearance HAZY (*)    Hgb urine dipstick LARGE (*)    Leukocytes,Ua TRACE (*)    All other components within normal limits  URINALYSIS, MICROSCOPIC (REFLEX) - Abnormal; Notable for the following components:   Bacteria,  UA RARE (*)    All other components within normal limits  CBG MONITORING, ED - Abnormal; Notable for the following components:   Glucose-Capillary 130 (*)    All other components within normal limits  CBC  PREGNANCY, URINE  PREGNANCY, URINE    EKG EKG Interpretation  Date/Time:  Saturday July 10 2022 23:54:27 EDT Ventricular Rate:  89 PR Interval:  150 QRS Duration: 80 QT Interval:  360 QTC Calculation: 438 R Axis:   75 Text Interpretation: Normal sinus rhythm Possible Left atrial enlargement Borderline ECG No previous ECGs available Confirmed by Geoffery Lyons ([id number]) on 07/10/2022 11:57:57 PM  Radiology No results found.  Procedures Procedures    Medications Ordered in ED Medications - No data to display  ED Course/ Medical Decision Making/ A&P  Patient presenting here with complaints of dizziness as described in the HPI.  She describes a spinning sensation that is worse when she lies flat and bends over to pick something up.    Her symptoms and presentation are most consistent with a peripheral vertigo.  Her laboratory studies are unremarkable showing no evidence for anemia, electrolyte disturbance, or other issue that would explain her symptoms.  EKG is normal.  Head CT obtained which is also negative.  Patient to be discharged with meclizine and return/follow-up as needed.  Final Clinical Impression(s) / ED Diagnoses Final diagnoses:  None    Rx / DC Orders ED Discharge Orders     None         Geoffery Lyons, MD 07/11/22 [phone]
